# Patient Record
Sex: Male | Born: 1995 | Race: White | Hispanic: No | Marital: Married | Smoking: Never smoker
Health system: Southern US, Community
[De-identification: ages and names within clinical notes are randomized; demographics above are authoritative.]

## PROBLEM LIST (undated history)

## (undated) DIAGNOSIS — R51 Headache: Secondary | ICD-10-CM

## (undated) HISTORY — DX: Headache: R51

## (undated) HISTORY — PX: OTHER SURGICAL HISTORY: SHX169

---
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
## 2020-05-19 ENCOUNTER — Other Ambulatory Visit: Payer: Self-pay

## 2020-05-19 ENCOUNTER — Emergency Department
Admission: EM | Admit: 2020-05-19 | Discharge: 2020-05-19 | Disposition: A | Discharge status: Home / Self Care | Payer: 59 | Attending: Emergency Medicine | Admitting: Emergency Medicine

## 2020-05-19 ENCOUNTER — Emergency Department: Payer: 59

## 2020-05-19 DIAGNOSIS — R000 Tachycardia, unspecified: Secondary | ICD-10-CM | POA: Diagnosis not present

## 2020-05-19 DIAGNOSIS — R109 Unspecified abdominal pain: Secondary | ICD-10-CM

## 2020-05-19 DIAGNOSIS — R1031 Right lower quadrant pain: Secondary | ICD-10-CM | POA: Diagnosis present

## 2020-05-19 LAB — URINALYSIS, COMPLETE (UACMP) WITH MICROSCOPIC
Bacteria, UA: NONE SEEN
Bilirubin Urine: NEGATIVE
Glucose, UA: NEGATIVE mg/dL
Hgb urine dipstick: NEGATIVE
Ketones, ur: NEGATIVE mg/dL
Leukocytes,Ua: NEGATIVE
Nitrite: NEGATIVE
Protein, ur: NEGATIVE mg/dL
Specific Gravity, Urine: 1.012 (ref 1.005–1.030)
Squamous Epithelial / HPF: NONE SEEN (ref 0–5)
pH: 6 (ref 5.0–8.0)

## 2020-05-19 LAB — CBC
HCT: 47.1 % (ref 39.0–52.0)
Hemoglobin: 16.9 g/dL (ref 13.0–17.0)
MCH: 30.8 pg (ref 26.0–34.0)
MCHC: 35.9 g/dL (ref 30.0–36.0)
MCV: 85.9 fL (ref 80.0–100.0)
Platelets: 248 10*3/uL (ref 150–400)
RBC: 5.48 MIL/uL (ref 4.22–5.81)
RDW: 12.6 % (ref 11.5–15.5)
WBC: 7.2 10*3/uL (ref 4.0–10.5)
nRBC: 0 % (ref 0.0–0.2)

## 2020-05-19 LAB — COMPREHENSIVE METABOLIC PANEL
ALT: 15 U/L (ref 0–44)
AST: 21 U/L (ref 15–41)
Albumin: 5.1 g/dL — ABNORMAL HIGH (ref 3.5–5.0)
Alkaline Phosphatase: 63 U/L (ref 38–126)
Anion gap: 12 (ref 5–15)
BUN: 20 mg/dL (ref 6–20)
CO2: 23 mmol/L (ref 22–32)
Calcium: 9.7 mg/dL (ref 8.9–10.3)
Chloride: 103 mmol/L (ref 98–111)
Creatinine, Ser: 1.04 mg/dL (ref 0.61–1.24)
GFR, Estimated: >60 mL/min (ref >60)
Glucose, Bld: 117 mg/dL — ABNORMAL HIGH (ref 70–99)
Potassium: 3.3 mmol/L — ABNORMAL LOW (ref 3.5–5.1)
Sodium: 138 mmol/L (ref 135–145)
Total Bilirubin: 1.4 mg/dL — ABNORMAL HIGH (ref 0.3–1.2)
Total Protein: 7.9 g/dL (ref 6.5–8.1)

## 2020-05-19 LAB — LIPASE, BLOOD: Lipase: 29 U/L (ref 11–51)

## 2020-05-19 MED ORDER — LACTATED RINGERS IV BOLUS
1000.0000 mL | Freq: Once | INTRAVENOUS | Status: AC
  Administered 2020-05-19: 1000 mL via INTRAVENOUS

## 2020-05-19 MED ORDER — DICYCLOMINE HCL 10 MG PO CAPS: 10.0000 mg | ORAL_CAPSULE | Freq: Four times a day (QID) | ORAL | 0 refills | Status: AC

## 2020-05-19 MED ORDER — IOHEXOL 9 MG/ML PO SOLN
500.0000 mL | ORAL | Status: AC
  Administered 2020-05-19 (×2): 500 mL via ORAL

## 2020-05-19 MED ORDER — IOHEXOL 300 MG/ML  SOLN
100.0000 mL | Freq: Once | INTRAMUSCULAR | Status: AC | PRN
  Administered 2020-05-19: 100 mL via INTRAVENOUS

## 2020-05-19 MED ORDER — ONDANSETRON HCL 4 MG PO TABS: 4.0000 mg | ORAL_TABLET | Freq: Three times a day (TID) | ORAL | 0 refills | Status: AC | PRN

## 2020-05-19 NOTE — ED Notes (Signed)
Extra tube sent : Lactic Acid

## 2020-05-19 NOTE — ED Notes (Signed)
Wife at bedside.

## 2020-05-19 NOTE — ED Triage Notes (Addendum)
Pt to ER via POV with complaints of RUQ, stabbing and sharp in nature, that started 2 days ago. Pain radiates into back and ribs. Reports some diarrhea that has been green in color. Denies NV. Unsure if it has been worse when eating. Pt smokes delta 8 and has been trying to wean himself off it.

## 2020-05-19 NOTE — ED Provider Notes (Signed)
[facility] Emergency Department Provider Note ____________________________________________   Event Date/Time   First MD Initiated Contact with Patient 05/19/20 1859     (approximate)  I have reviewed the triage vital signs and the nursing notes.   HISTORY  Chief Complaint Abdominal Pain    HPI Gabriel Rich is a 25 y.o. male with PMH as noted below who presents with right-sided abdominal pain over approximately last 3 days, constant, somewhat radiating to the back, with no specific exacerbating or relieving factors.  The patient denies any associated nausea, vomiting, or diarrhea.  He has had a normal appetite and denies any change in the pain after eating.  The patient takes delta 8 [facility]) but denies other drug use.  Past Medical History:  Diagnosis Date  . VEHMCNOB(096.2)     Patient Active Problem List   Diagnosis Date Noted  . Migraine without aura, without mention of intractable migraine without mention of status migrainosus 08/11/2012  . Tension headache 08/11/2012  . Anxiety state, unspecified 08/11/2012    Past Surgical History:  Procedure Laterality Date  . Pyloric Stenosis  1999    Prior to Admission medications   Medication Sig Start Date End Date Taking? Authorizing Provider  cyproheptadine (PERIACTIN) 4 MG tablet Take 1 tablet (4 mg total) by mouth at bedtime. 09/10/12   Keturah Shavers, MD  ibuprofen (ADVIL,MOTRIN) 200 MG tablet Take 400 mg by mouth every 6 (six) hours as needed for pain.    [provider]  Magnesium Oxide 250 MG TABS Take by mouth.    [provider]  riboflavin (VITAMIN B-2) 100 MG TABS Take 100 mg by mouth daily.    [provider]    Allergies Patient has no known allergies.  Family History  Problem Relation Age of Onset  . Cervical cancer Mother     Social History Social History   Tobacco Use  . Smoking status: Never Smoker  . Smokeless tobacco: Never Used  Substance Use  Topics  . Alcohol use: No  . Drug use: No    Review of Systems  Constitutional: No fever/chills. Eyes: No visual changes. ENT: No sore throat. Cardiovascular: Denies chest pain. Respiratory: Denies shortness of breath. Gastrointestinal: No nausea, no vomiting.  No diarrhea.  Genitourinary: Negative for dysuria or hematuria.  Musculoskeletal: Negative for back pain. Skin: Negative for rash. Neurological: Negative for headaches, focal weakness or numbness.   ____________________________________________   PHYSICAL EXAM:  VITAL SIGNS: ED Triage Vitals [05/19/20 1828]  Enc Vitals Group     BP (!) 153/100     Pulse Rate (!) 120     Resp 18     Temp 99 F (37.2 C)     Temp Source Oral     SpO2 99 %     Weight      Height 5\' 10"  (1.778 m)     Head Circumference      Peak Flow      Pain Score 3     Pain Loc      Pain Edu?      Excl. in GC?     Constitutional: Alert and oriented.  Slightly anxious appearing but in no acute distress. Eyes: Conjunctivae are normal.  Head: Atraumatic. Nose: No congestion/rhinnorhea. Mouth/Throat: Mucous membranes are moist.   Neck: Normal range of motion.  Cardiovascular: Normal rate, regular rhythm. Good peripheral circulation. Respiratory: Normal respiratory effort.  No retractions. Gastrointestinal: Soft with minimal right mid and lower abdominal tenderness.  No peritoneal signs.  No distention.  Genitourinary: No flank tenderness. Musculoskeletal: Extremities warm and well perfused.  Neurologic:  Normal speech and language. No gross focal neurologic deficits are appreciated.  Skin:  Skin is warm and dry. No rash noted. Psychiatric: Mood and affect are normal. Speech and behavior are normal.  ____________________________________________   LABS (all labs ordered are listed, but only abnormal results are displayed)  Labs Reviewed  COMPREHENSIVE METABOLIC PANEL - Abnormal; Notable for the following components:      Result Value    Potassium 3.3 (*)    Glucose, Bld 117 (*)    Albumin 5.1 (*)    Total Bilirubin 1.4 (*)    All other components within normal limits  URINALYSIS, COMPLETE (UACMP) WITH MICROSCOPIC - Abnormal; Notable for the following components:   Color, Urine YELLOW (*)    APPearance CLEAR (*)    All other components within normal limits  LIPASE, BLOOD  CBC   ____________________________________________  EKG   ____________________________________________  RADIOLOGY  CT abdomen/pelvis: Pending  ____________________________________________   PROCEDURES  Procedure(s) performed: No  Procedures  Critical Care performed: No ____________________________________________   INITIAL IMPRESSION / ASSESSMENT AND PLAN / ED COURSE  Pertinent labs & imaging results that were available during my care of the patient were reviewed by me and considered in my medical decision making (see chart for details).  25 year old male with PMH as noted above presents with right-sided abdominal pain over the last several days with no associated vomiting or diarrhea.  On exam, the patient is overall well-appearing.  He is tachycardic with borderline elevated temperature and blood pressure.  The abdomen is soft with very mild tenderness to the right mid and lower abdomen with no peritoneal signs.  Overall the pain is poorly localized.  Differential includes appendicitis, biliary colic, cholecystitis, colitis, diverticulitis, enteritis, or less likely ureteral stone, pyelonephritis.  We will obtain lab work-up and CT for further evaluation.  ----------------------------------------- 8:50 PM on 05/19/2020 -----------------------------------------  CT abdomen is pending.  I signed the patient out to the oncoming ED physician Dr. Katrinka Blazing.  ____________________________________________   FINAL CLINICAL IMPRESSION(S) / ED DIAGNOSES  Final diagnoses:  Abdominal pain, unspecified abdominal location      NEW  MEDICATIONS STARTED DURING THIS VISIT:  New Prescriptions   No medications on file     Note:  This document was prepared using Dragon voice recognition software and may include unintentional dictation errors.    Dionne Bucy, MD 05/19/20 2050

## 2020-05-19 NOTE — ED Provider Notes (Signed)
I assumed care of this patient approximately 8:30 PM.  Please see outgoing providers note for full details regarding patient's initial evaluation assessment.  In brief patient presents for assessment of from acute onset of right abdominal pain associate with some bloody nonbilious emesis and some diarrhea for last 3 days.  Initial concern is for likely gastroenteritis given overall description of symptoms and reassuring lipase CMP CBC and UA.  However plan is to obtain CT abdomen pelvis to rule out other acute abdominal pelvic pathology including appendicitis.  CT shows no clear acute abdominal pelvic process including evidence of appendicitis, diverticulitis, pancreatitis, cystitis or any other clear acute process.  On my reassessment patient states he feels much better.  Discussed expected clinical course of likely infectious gastroenteritis.  Rx written for Zofran and Bentyl.  Discharge stable condition.  Strict return precautions advised and discussed.   Gilles Chiquito, MD 05/19/20 [phone]

## 2020-06-05 ENCOUNTER — Emergency Department
Admission: EM | Admit: 2020-06-05 | Discharge: 2020-06-05 | Disposition: A | Discharge status: Home / Self Care | Payer: 59 | Attending: Emergency Medicine | Admitting: Emergency Medicine

## 2020-06-05 ENCOUNTER — Encounter: Payer: Self-pay | Admitting: Intensive Care

## 2020-06-05 ENCOUNTER — Other Ambulatory Visit: Payer: Self-pay

## 2020-06-05 DIAGNOSIS — R194 Change in bowel habit: Secondary | ICD-10-CM | POA: Insufficient documentation

## 2020-06-05 DIAGNOSIS — R109 Unspecified abdominal pain: Secondary | ICD-10-CM | POA: Diagnosis present

## 2020-06-05 LAB — CBC
HCT: 48.1 % (ref 39.0–52.0)
Hemoglobin: 16.8 g/dL (ref 13.0–17.0)
MCH: 30.6 pg (ref 26.0–34.0)
MCHC: 34.9 g/dL (ref 30.0–36.0)
MCV: 87.6 fL (ref 80.0–100.0)
Platelets: 199 K/uL (ref 150–400)
RBC: 5.49 MIL/uL (ref 4.22–5.81)
RDW: 12.5 % (ref 11.5–15.5)
WBC: 5.5 K/uL (ref 4.0–10.5)
nRBC: 0 % (ref 0.0–0.2)

## 2020-06-05 LAB — COMPREHENSIVE METABOLIC PANEL WITH GFR
ALT: 12 U/L (ref 0–44)
AST: 17 U/L (ref 15–41)
Albumin: 4.8 g/dL (ref 3.5–5.0)
Alkaline Phosphatase: 50 U/L (ref 38–126)
Anion gap: 11 (ref 5–15)
BUN: 17 mg/dL (ref 6–20)
CO2: 26 mmol/L (ref 22–32)
Calcium: 9.9 mg/dL (ref 8.9–10.3)
Chloride: 102 mmol/L (ref 98–111)
Creatinine, Ser: 1.07 mg/dL (ref 0.61–1.24)
GFR, Estimated: >60 mL/min (ref >60)
Glucose, Bld: 109 mg/dL — ABNORMAL HIGH (ref 70–99)
Potassium: 3.6 mmol/L (ref 3.5–5.1)
Sodium: 139 mmol/L (ref 135–145)
Total Bilirubin: 1.7 mg/dL — ABNORMAL HIGH (ref 0.3–1.2)
Total Protein: 7.8 g/dL (ref 6.5–8.1)

## 2020-06-05 LAB — URINALYSIS, COMPLETE (UACMP) WITH MICROSCOPIC
Bacteria, UA: NONE SEEN
Bilirubin Urine: NEGATIVE
Glucose, UA: NEGATIVE mg/dL
Hgb urine dipstick: NEGATIVE
Ketones, ur: 5 mg/dL — AB
Leukocytes,Ua: NEGATIVE
Nitrite: NEGATIVE
Protein, ur: NEGATIVE mg/dL
Specific Gravity, Urine: 1.025 (ref 1.005–1.030)
Squamous Epithelial / HPF: NONE SEEN (ref 0–5)
pH: 6 (ref 5.0–8.0)

## 2020-06-05 LAB — LIPASE, BLOOD: Lipase: 45 U/L (ref 11–51)

## 2020-06-05 MED ORDER — SUCRALFATE 1 G PO TABS: 1.0000 g | ORAL_TABLET | Freq: Four times a day (QID) | ORAL | 0 refills | Status: AC

## 2020-06-05 MED ORDER — DICYCLOMINE HCL 10 MG PO CAPS: 10.0000 mg | ORAL_CAPSULE | Freq: Four times a day (QID) | ORAL | 0 refills | Status: AC

## 2020-06-05 MED ORDER — SUCRALFATE 1 G PO TABS: 1.0000 g | ORAL_TABLET | Freq: Four times a day (QID) | ORAL | 0 refills | Status: DC

## 2020-06-05 MED ORDER — DICYCLOMINE HCL 10 MG PO CAPS: 10.0000 mg | ORAL_CAPSULE | Freq: Four times a day (QID) | ORAL | 0 refills | Status: DC

## 2020-06-05 MED ORDER — SUCRALFATE 1 G PO TABS
1.0000 g | ORAL_TABLET | Freq: Once | ORAL | Status: AC
  Administered 2020-06-05: 1 g via ORAL
  Filled 2020-06-05: qty 1

## 2020-06-05 NOTE — ED Provider Notes (Signed)
[facility] Emergency Department Provider Note   ____________________________________________   I have reviewed the triage vital signs and the nursing notes.   HISTORY  Chief Complaint Abdominal Pain and Nausea   History limited by: Not Limited   HPI Gabriel Rich is a 25 y.o. male who presents to the emergency department today because of concerns for abdominal pain and abnormal stools.  States he has now been dealing with abdominal pain for the past month.  He says that initially the pain started more in the right side of his abdomen but now feels like it is across his whole abdomen.  In addition he has noticed a change to his stool.  It has become more yellow as well as seeing small white objects in his stool.  He denies any unintentional weight loss.  He denies any fevers.  He states the pain will become so severe he will cry.  The patient has tried over-the-counter abdominal medications for the pain without any significant relief.  Is also tried some Bentyl she says did not provide any significant relief.     Records reviewed. Per medical record review patient has a history of ER visit 3 weeks ago with work up including CT abd/pel without obvious source of the patient's pain.   Past Medical History:  Diagnosis Date  . IRCVELFY(101.7)     Patient Active Problem List   Diagnosis Date Noted  . Migraine without aura, without mention of intractable migraine without mention of status migrainosus 08/11/2012  . Tension headache 08/11/2012  . Anxiety state, unspecified 08/11/2012    Past Surgical History:  Procedure Laterality Date  . Pyloric Stenosis  1999    Prior to Admission medications   Medication Sig Start Date End Date Taking? Authorizing Provider  cyproheptadine (PERIACTIN) 4 MG tablet Take 1 tablet (4 mg total) by mouth at bedtime. 09/10/12   Keturah Shavers, MD  dicyclomine (BENTYL) 10 MG capsule Take 1 capsule (10 mg total) by mouth 4 (four)  times daily for 3 days. 05/19/20 05/22/20  Gilles Chiquito, MD  ibuprofen (ADVIL,MOTRIN) 200 MG tablet Take 400 mg by mouth every 6 (six) hours as needed for pain.    [provider]  Magnesium Oxide 250 MG TABS Take by mouth.    [provider]  ondansetron (ZOFRAN) 4 MG tablet Take 1 tablet (4 mg total) by mouth every 8 (eight) hours as needed for up to 10 doses for nausea or vomiting. 05/19/20   Gilles Chiquito, MD  riboflavin (VITAMIN B-2) 100 MG TABS Take 100 mg by mouth daily.    [provider]    Allergies Patient has no known allergies.  Family History  Problem Relation Age of Onset  . Cervical cancer Mother     Social History Social History   Tobacco Use  . Smoking status: Never Smoker  . Smokeless tobacco: Never Used  Substance Use Topics  . Alcohol use: No  . Drug use: No    Review of Systems Constitutional: No fever/chills Eyes: No visual changes. ENT: No sore throat. Cardiovascular: Denies chest pain. Respiratory: Denies shortness of breath. Gastrointestinal: Positive for abdominal pain, change in stool.  Genitourinary: Negative for dysuria. Musculoskeletal: Negative for back pain. Skin: Negative for rash. Neurological: Negative for headaches, focal weakness or numbness.  ____________________________________________   PHYSICAL EXAM:  VITAL SIGNS: ED Triage Vitals  Enc Vitals Group     BP 06/05/20 1243 130/86     Pulse Rate 06/05/20 1243 (!)  110     Resp 06/05/20 1243 16     Temp 06/05/20 1243 98.2 F (36.8 C)     Temp Source 06/05/20 1243 Oral     SpO2 06/05/20 1243 98 %     Weight 06/05/20 1244 135 lb (61.2 kg)     Height 06/05/20 1244 5\' 10"  (1.778 m)     Head Circumference --      Peak Flow --      Pain Score 06/05/20 1244 3   Constitutional: Alert and oriented.  Eyes: Conjunctivae are normal.  ENT      Head: Normocephalic and atraumatic.      Nose: No congestion/rhinnorhea.      Mouth/Throat: Mucous membranes  are moist.      Neck: No stridor. Hematological/Lymphatic/Immunilogical: No cervical lymphadenopathy. Cardiovascular: Normal rate, regular rhythm.  No murmurs, rubs, or gallops.  Respiratory: Normal respiratory effort without tachypnea nor retractions. Breath sounds are clear and equal bilaterally. No wheezes/rales/rhonchi. Gastrointestinal: Soft and non tender. No rebound. No guarding.  Genitourinary: Deferred Musculoskeletal: Normal range of motion in all extremities. No lower extremity edema. Neurologic:  Normal speech and language. No gross focal neurologic deficits are appreciated.  Skin:  Skin is warm, dry and intact. No rash noted. Psychiatric: Mood and affect are normal. Speech and behavior are normal. Patient exhibits appropriate insight and judgment.  ____________________________________________    LABS (pertinent positives/negatives)  CMP wnl except glu 109, t bili 1.7 UA clear, ketones 5, rbc and wbc 0-5 Lipase 45 CBC wbc 5.5, hgb 16.8, plt 199 ____________________________________________   EKG  None  ____________________________________________    RADIOLOGY  None  ____________________________________________   PROCEDURES  Procedures  ____________________________________________   INITIAL IMPRESSION / ASSESSMENT AND PLAN / ED COURSE  Pertinent labs & imaging results that were available during my care of the patient were reviewed by me and considered in my medical decision making (see chart for details).   Patient presented to the emergency department because of concern for abdominal pain and stool change. The patient states that it has been going on for almost a month. On exam today patient without any tenderness. Did offer to obtain stool studies however patient was unable to produce stool here. Did perhaps feel better with carafate. At this time do not feel repeat emergent imaging is necessary. Has follow up with GI scheduled.    ____________________________________________   FINAL CLINICAL IMPRESSION(S) / ED DIAGNOSES  Final diagnoses:  Abdominal pain, unspecified abdominal location     Note: This dictation was prepared with Dragon dictation. Any transcriptional errors that result from this process are unintentional     06/07/20, MD 06/05/20 2005

## 2020-06-05 NOTE — Discharge Instructions (Addendum)
Please seek medical attention for any high fevers, chest pain, shortness of breath, change in behavior, persistent vomiting, bloody stool or any other new or concerning symptoms.  

## 2020-06-05 NOTE — ED Triage Notes (Signed)
Patient c/o abdominal pain with nausea. Reports he is finding white spots in his stool and believes he has parasites. Also c/o feeling tired often. Was seen for same on 05/19/20 but reports now the abdominal pain is spreading and worse.

## 2020-06-14 ENCOUNTER — Other Ambulatory Visit: Payer: Self-pay

## 2020-06-14 ENCOUNTER — Emergency Department
Admission: EM | Admit: 2020-06-14 | Discharge: 2020-06-15 | Disposition: A | Discharge status: Home / Self Care | Payer: 59 | Attending: Emergency Medicine | Admitting: Emergency Medicine

## 2020-06-14 DIAGNOSIS — R200 Anesthesia of skin: Secondary | ICD-10-CM | POA: Insufficient documentation

## 2020-06-14 DIAGNOSIS — R000 Tachycardia, unspecified: Secondary | ICD-10-CM | POA: Diagnosis not present

## 2020-06-14 DIAGNOSIS — R112 Nausea with vomiting, unspecified: Secondary | ICD-10-CM

## 2020-06-14 DIAGNOSIS — R079 Chest pain, unspecified: Secondary | ICD-10-CM | POA: Diagnosis present

## 2020-06-14 DIAGNOSIS — F419 Anxiety disorder, unspecified: Secondary | ICD-10-CM

## 2020-06-14 DIAGNOSIS — R109 Unspecified abdominal pain: Secondary | ICD-10-CM

## 2020-06-14 DIAGNOSIS — R1010 Upper abdominal pain, unspecified: Secondary | ICD-10-CM | POA: Insufficient documentation

## 2020-06-14 DIAGNOSIS — R0789 Other chest pain: Secondary | ICD-10-CM

## 2020-06-14 DIAGNOSIS — G8929 Other chronic pain: Secondary | ICD-10-CM

## 2020-06-14 LAB — T4, FREE: Free T4: 0.98 ng/dL (ref 0.61–1.12)

## 2020-06-14 LAB — CBC WITH DIFFERENTIAL/PLATELET
Abs Immature Granulocytes: 0.01 10*3/uL (ref 0.00–0.07)
Basophils Absolute: 0.1 10*3/uL (ref 0.0–0.1)
Basophils Relative: 1 %
Eosinophils Absolute: 0.3 10*3/uL (ref 0.0–0.5)
Eosinophils Relative: 6 %
HCT: 42.8 % (ref 39.0–52.0)
Hemoglobin: 15.1 g/dL (ref 13.0–17.0)
Immature Granulocytes: 0 %
Lymphocytes Relative: 29 %
Lymphs Abs: 1.7 10*3/uL (ref 0.7–4.0)
MCH: 31.3 pg (ref 26.0–34.0)
MCHC: 35.3 g/dL (ref 30.0–36.0)
MCV: 88.8 fL (ref 80.0–100.0)
Monocytes Absolute: 0.5 10*3/uL (ref 0.1–1.0)
Monocytes Relative: 9 %
Neutro Abs: 3.2 10*3/uL (ref 1.7–7.7)
Neutrophils Relative %: 55 %
Platelets: 201 10*3/uL (ref 150–400)
RBC: 4.82 MIL/uL (ref 4.22–5.81)
RDW: 12.4 % (ref 11.5–15.5)
WBC: 5.7 10*3/uL (ref 4.0–10.5)
nRBC: 0 % (ref 0.0–0.2)

## 2020-06-14 LAB — COMPREHENSIVE METABOLIC PANEL
ALT: 13 U/L (ref 0–44)
AST: 17 U/L (ref 15–41)
Albumin: 4.4 g/dL (ref 3.5–5.0)
Alkaline Phosphatase: 45 U/L (ref 38–126)
Anion gap: 10 (ref 5–15)
BUN: 15 mg/dL (ref 6–20)
CO2: 23 mmol/L (ref 22–32)
Calcium: 9.5 mg/dL (ref 8.9–10.3)
Chloride: 106 mmol/L (ref 98–111)
Creatinine, Ser: 0.93 mg/dL (ref 0.61–1.24)
GFR, Estimated: >60 mL/min (ref >60)
Glucose, Bld: 117 mg/dL — ABNORMAL HIGH (ref 70–99)
Potassium: 3.5 mmol/L (ref 3.5–5.1)
Sodium: 139 mmol/L (ref 135–145)
Total Bilirubin: 0.9 mg/dL (ref 0.3–1.2)
Total Protein: 7.3 g/dL (ref 6.5–8.1)

## 2020-06-14 LAB — TSH: TSH: 0.795 u[IU]/mL (ref 0.350–4.500)

## 2020-06-14 LAB — TROPONIN I (HIGH SENSITIVITY): Troponin I (High Sensitivity): <2 ng/L (ref <18)

## 2020-06-14 MED ORDER — ACETAMINOPHEN 500 MG PO TABS
1000.0000 mg | ORAL_TABLET | Freq: Once | ORAL | Status: AC
  Administered 2020-06-14: 1000 mg via ORAL
  Filled 2020-06-14: qty 2

## 2020-06-14 MED ORDER — LORAZEPAM 2 MG/ML IJ SOLN
0.5000 mg | Freq: Once | INTRAMUSCULAR | Status: AC
  Administered 2020-06-14: 0.5 mg via INTRAVENOUS
  Filled 2020-06-14: qty 1

## 2020-06-14 MED ORDER — SODIUM CHLORIDE 0.9 % IV BOLUS
1000.0000 mL | Freq: Once | INTRAVENOUS | Status: AC
  Administered 2020-06-14: 1000 mL via INTRAVENOUS

## 2020-06-14 NOTE — ED Provider Notes (Addendum)
[facility] Emergency Department Provider Note  ____________________________________________   Event Date/Time   First MD Initiated Contact with Patient 06/14/20 2236     (approximate)  I have reviewed the triage vital signs and the nursing notes.   HISTORY  Chief Complaint Chest Pain    HPI Gabriel Rich is a 25 y.o. male with headaches and anxiety who comes in with chest pain.  Patient reports that he today he developed left arm numbness which she describes as feeling heavy and "just not feeling quite right".  He states that he then developed some chest pain that was on the left shoulder and sharp stabbing pain in his chest.  He also reports chronic upper abdominal pain as well that is severe, constant.  He states that he is told that he has gallstones but I reviewed his prior CT and there are no gallstones mention.  Patient has GI follow-up in 2 days. He also reports some testicle pain none currently but previously and stretch marks by his legs.       Past Medical History:  Diagnosis Date  . VVOHYWVP(710.6)     Patient Active Problem List   Diagnosis Date Noted  . Migraine without aura, without mention of intractable migraine without mention of status migrainosus 08/11/2012  . Tension headache 08/11/2012  . Anxiety state, unspecified 08/11/2012    Past Surgical History:  Procedure Laterality Date  . Pyloric Stenosis  1999    Prior to Admission medications   Medication Sig Start Date End Date Taking? Authorizing Provider  cyproheptadine (PERIACTIN) 4 MG tablet Take 1 tablet (4 mg total) by mouth at bedtime. 09/10/12   Keturah Shavers, MD  dicyclomine (BENTYL) 10 MG capsule Take 1 capsule (10 mg total) by mouth 4 (four) times daily for 3 days. 05/19/20 05/22/20  Gilles Chiquito, MD  dicyclomine (BENTYL) 10 MG capsule Take 1 capsule (10 mg total) by mouth 4 (four) times daily for 14 days. 06/05/20 06/19/20  Phineas Semen, MD  ibuprofen  (ADVIL,MOTRIN) 200 MG tablet Take 400 mg by mouth every 6 (six) hours as needed for pain.    [provider]  Magnesium Oxide 250 MG TABS Take by mouth.    [provider]  ondansetron (ZOFRAN) 4 MG tablet Take 1 tablet (4 mg total) by mouth every 8 (eight) hours as needed for up to 10 doses for nausea or vomiting. 05/19/20   Gilles Chiquito, MD  riboflavin (VITAMIN B-2) 100 MG TABS Take 100 mg by mouth daily.    [provider]  sucralfate (CARAFATE) 1 g tablet Take 1 tablet (1 g total) by mouth 4 (four) times daily. 06/05/20   Phineas Semen, MD    Allergies Bentyl [dicyclomine]  Family History  Problem Relation Age of Onset  . Cervical cancer Mother     Social History Social History   Tobacco Use  . Smoking status: Never Smoker  . Smokeless tobacco: Never Used  Substance Use Topics  . Alcohol use: No  . Drug use: No      Review of Systems Constitutional: No fever/chills Eyes: No visual changes. ENT: No sore throat. Cardiovascular: Positive chest pain Respiratory: Denies shortness of breath. Gastrointestinal: Positive abdominal pain no nausea, no vomiting.  No diarrhea.  No constipation. Genitourinary: Negative for dysuria. Musculoskeletal: Negative for back pain. Skin: Negative for rash. Neurological: Negative for headaches, positive for numbness All other ROS negative ____________________________________________   PHYSICAL EXAM:  VITAL SIGNS: ED Triage Vitals  Enc Vitals Group     BP 06/14/20 2237 (!) 153/86     Pulse Rate 06/14/20 2237 (!) 127     Resp 06/14/20 2237 18     Temp 06/14/20 2238 98.3 F (36.8 C)     Temp Source 06/14/20 2238 Oral     SpO2 06/14/20 2237 100 %     Weight 06/14/20 2235 140 lb (63.5 kg)     Height 06/14/20 2235 5\' 10"  (1.778 m)     Head Circumference --      Peak Flow --      Pain Score 06/14/20 2234 6     Pain Loc --      Pain Edu? --      Excl. in [place]? --     Constitutional: Alert and  oriented. Well appearing and in no acute distress.  Patient does appear very anxious Eyes: Conjunctivae are normal. EOMI. Head: Atraumatic. Nose: No congestion/rhinnorhea. Mouth/Throat: Mucous membranes are moist.   Neck: No stridor. Trachea Midline. FROM Cardiovascular: Tachycardic, regular rhythm. Grossly normal heart sounds.  Good peripheral circulation. Respiratory: Normal respiratory effort.  No retractions. Lungs CTAB. Gastrointestinal: Tender in the upper abdomen.  No distention. No abdominal bruits.  Musculoskeletal: No lower extremity tenderness nor edema.  No joint effusions. Neurologic:  Normal speech and language.  No pronator drift but does report that the left arm just does not feel the same as the right arm.  Good strength in legs.  Equal sensation in legs. Skin:  Skin is warm, dry and intact. No rash noted. Psychiatric: Mood and affect are normal. Speech and behavior are normal. GU: Deferred   ____________________________________________   LABS (all labs ordered are listed, but only abnormal results are displayed)  Labs Reviewed  CHLAMYDIA/NGC RT PCR ([facility] ONLY)  CBC WITH DIFFERENTIAL/PLATELET  COMPREHENSIVE METABOLIC PANEL  URINALYSIS, COMPLETE (UACMP) WITH MICROSCOPIC  TSH  T4, FREE  TROPONIN I (HIGH SENSITIVITY)   ____________________________________________   ED ECG REPORT I, 2235, the attending physician, personally viewed and interpreted this ECG.  Sinus tachycardia rate of 123, no ST elevation, T wave version in lead III and V3, normal intervals ____________________________________________  RADIOLOGY Concha Se, personally viewed and evaluated these images (plain radiographs) as part of my medical decision making, as well as reviewing the written report by the radiologist.  ED MD interpretation: Pending  Official radiology report(s): No results  found.  ____________________________________________   PROCEDURES  Procedure(s) performed (including Critical Care):  .1-3 Lead EKG Interpretation Performed by: Vela Prose, MD Authorized by: Concha Se, MD     Interpretation: abnormal     ECG rate:  100s   ECG rate assessment: tachycardic     Rhythm: sinus tachycardia     Ectopy: none     Conduction: normal   Comments:     Patient initially sinus tachycardia but came down with fluids to sinus     ____________________________________________   INITIAL IMPRESSION / ASSESSMENT AND PLAN / ED COURSE  Gabriel Rich was evaluated in Emergency Department on 06/14/2020 for the symptoms described in the history of present illness. He was evaluated in the context of the global COVID-19 pandemic, which necessitated consideration that the patient might be at risk for infection with the SARS-CoV-2 virus that causes COVID-19. Institutional protocols and algorithms that pertain to the evaluation of patients at risk for COVID-19 are in a state of rapid change based on information released by regulatory bodies including the CDC and  federal and state organizations. These policies and algorithms were followed during the patient's care in the ED.    Patient is a 25 year old who comes in hypertensive and significantly tachycardic with both chest abdominal pain and reported left arm heaviness.  His neuro exam is otherwise very reassuring I have very low suspicion for intracranial hemorrhage or stroke will get CT head to further evaluate.  I also will get CT dissection to make sure no evidence of dissection.  Will get labs to evaluate for ACS, electrolyte abnormalities, thyroid dysfunction.  We will give some Tylenol, IV Ativan and some fluids to help with symptoms given I do feel the patient seems to be very anxious.  Patient will be handed off to oncoming team pending labs, CT scans and reevaluation.          ____________________________________________   FINAL CLINICAL IMPRESSION(S) / ED DIAGNOSES   Final diagnoses:  None      MEDICATIONS GIVEN DURING THIS VISIT:  Medications  acetaminophen (TYLENOL) tablet 1,000 mg (has no administration in time range)  LORazepam (ATIVAN) injection 0.5 mg (has no administration in time range)  sodium chloride 0.9 % bolus 1,000 mL (1,000 mLs Intravenous New Bag/Given 06/14/20 2304)     ED Discharge Orders    None       Note:  This document was prepared using Dragon voice recognition software and may include unintentional dictation errors.   Concha Se, MD 06/14/20 2316    Concha Se, MD 06/14/20 [phone]

## 2020-06-14 NOTE — ED Provider Notes (Signed)
-----------------------------------------   11:55 PM on 06/14/2020 -----------------------------------------  Assuming care from Dr. Fuller Plan.  In short, Gabriel Rich is a 25 y.o. male with multiple complaints.  Refer to the original H&P for additional details.  The current plan of care is to follow up labs and imaging.    ----------------------------------------- 2:19 AM on 06/15/2020 -----------------------------------------  The patient's urinalysis, GC/chlamydia, free T4, TSH, comprehensive metabolic panel, CBC, and high-sensitivity troponin x 2 are all within normal limits.  The patient's CTA chest/abdomen/pelvis and head CT are also within normal limits showing no evidence of any acute abnormality.  I provided reassurance to the patient and his wife and suggested that anxiety and/or panic attacks may be playing a significant role in his symptoms.  He understands.  He said that he has a follow-up appointment in 2 days with gastroenterology and I encouraged him to keep that appointment.  I gave my usual and customary return precautions.   Loleta Rose, MD 06/15/20 [phone]

## 2020-06-14 NOTE — ED Triage Notes (Addendum)
Pt states for the past week his has been having left arm numbness, pt states worse today as well as a heavy feeling in the middle of his chest. Pt states at times he does also feel sob, pt denies dizziness or light headedness. Pt denies any cardiac hx, pt states that he is suspected to have gallstones have been here recently for right side abd pain

## 2020-06-15 ENCOUNTER — Emergency Department: Payer: 59

## 2020-06-15 ENCOUNTER — Encounter: Payer: Self-pay | Admitting: Radiology

## 2020-06-15 LAB — URINALYSIS, COMPLETE (UACMP) WITH MICROSCOPIC
Bacteria, UA: NONE SEEN
Bilirubin Urine: NEGATIVE
Glucose, UA: NEGATIVE mg/dL
Hgb urine dipstick: NEGATIVE
Ketones, ur: 5 mg/dL — AB
Leukocytes,Ua: NEGATIVE
Nitrite: NEGATIVE
Protein, ur: NEGATIVE mg/dL
Specific Gravity, Urine: 1.019 (ref 1.005–1.030)
Squamous Epithelial / HPF: NONE SEEN (ref 0–5)
pH: 6 (ref 5.0–8.0)

## 2020-06-15 LAB — CHLAMYDIA/NGC RT PCR (ARMC ONLY)
Chlamydia Tr: NOT DETECTED
N gonorrhoeae: NOT DETECTED

## 2020-06-15 LAB — TROPONIN I (HIGH SENSITIVITY): Troponin I (High Sensitivity): <2 ng/L (ref <18)

## 2020-06-15 MED ORDER — IOHEXOL 350 MG/ML SOLN
100.0000 mL | Freq: Once | INTRAVENOUS | Status: AC | PRN
  Administered 2020-06-15: 100 mL via INTRAVENOUS

## 2020-06-15 NOTE — ED Notes (Signed)
Pt ambulatory to POV without difficulty. VSS. NAD.

## 2020-06-15 NOTE — Discharge Instructions (Signed)
Your workup in the Emergency Department today was reassuring.  We did not find any specific abnormalities.  We recommend you drink plenty of fluids, take your regular medications and/or any new ones prescribed today, and follow up with the doctor(s) listed in these documents as recommended.  Return to the Emergency Department if you develop new or worsening symptoms that concern you.  

## 2022-01-06 IMAGING — CT CT ABD-PELV W/ CM
2 of 4 series · 16 of 46 positions shown, 18 images · IV contrast (APPLIED)
Comparison: None.

CLINICAL DATA: Right lower quadrant pain

EXAM:
CT ABDOMEN AND PELVIS WITH CONTRAST
TECHNIQUE: Multidetector CT imaging of the abdomen and pelvis was performed
using the standard protocol following bolus administration of
intravenous contrast.
CONTRAST:  100mL OMNIPAQUE IOHEXOL 300 MG/ML  SOLN

[Series 2: axial st · axial · 0.71mm/px · z∈[-581,-181]mm · 13 of 88 slices shown, 15 images]
[im 4/88  soft-tissue]
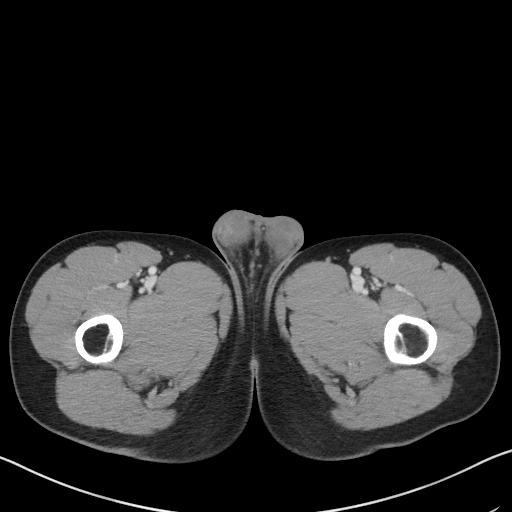
[im 4/88  bone]
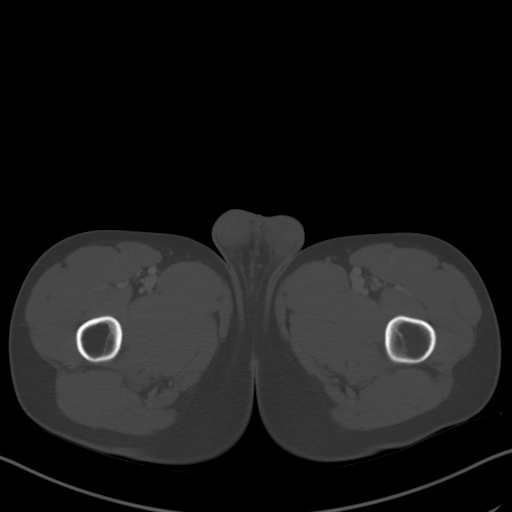
[im 11/88  soft-tissue]
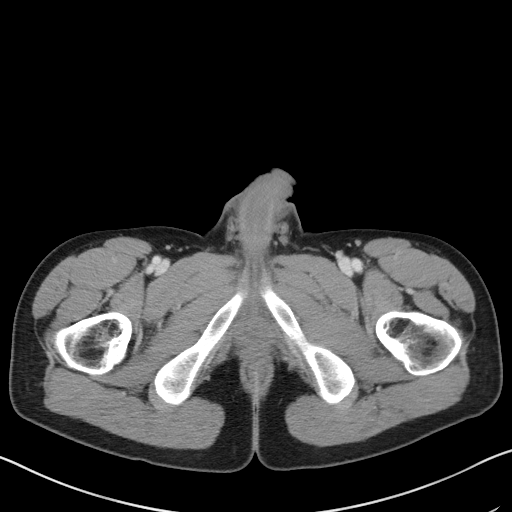
[im 18/88  soft-tissue]
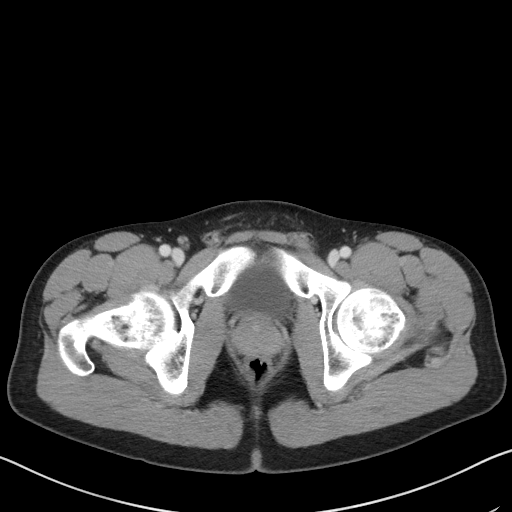
[im 25/88  soft-tissue]
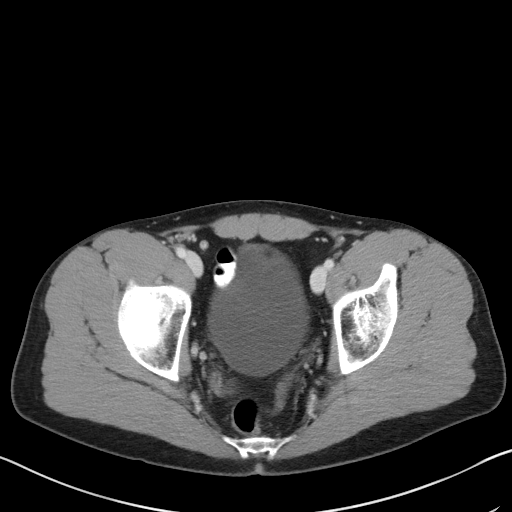
[im 32/88  soft-tissue]
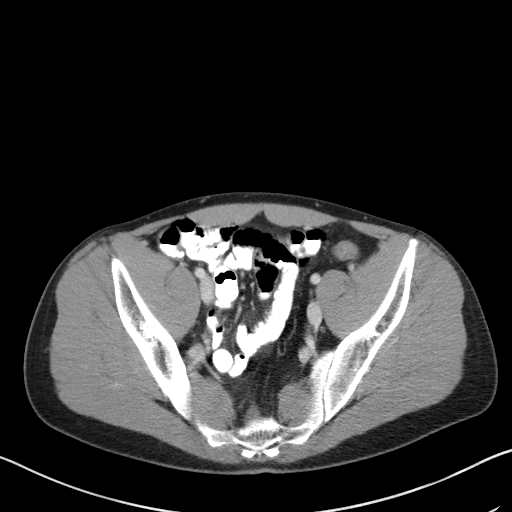
[im 39/88  soft-tissue]
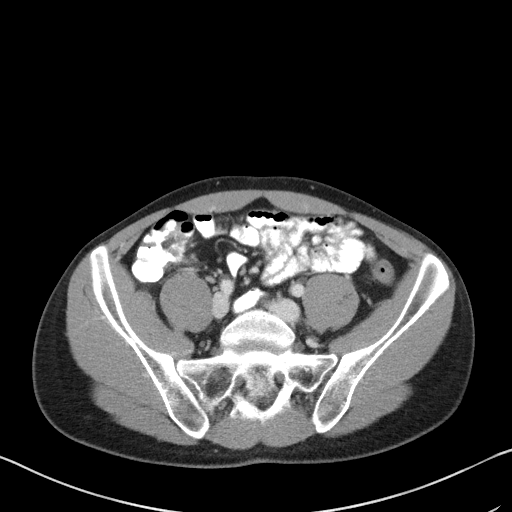
[im 46/88  soft-tissue]
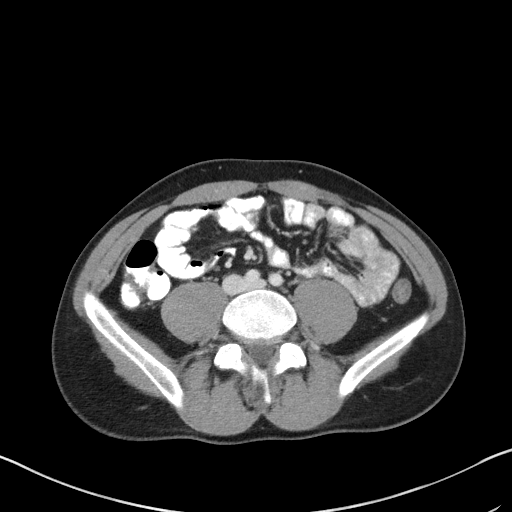
[im 49/88  soft-tissue]
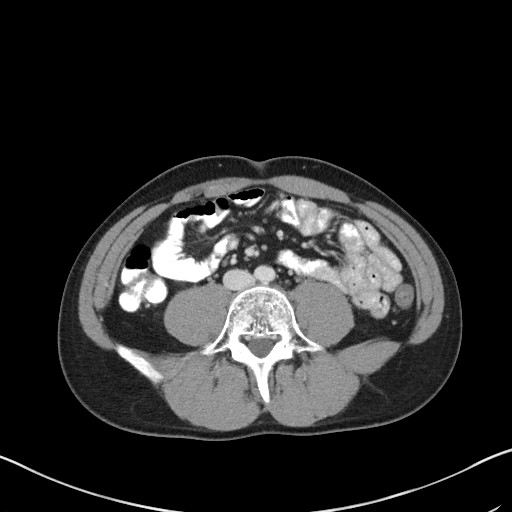
[im 56/88  soft-tissue]
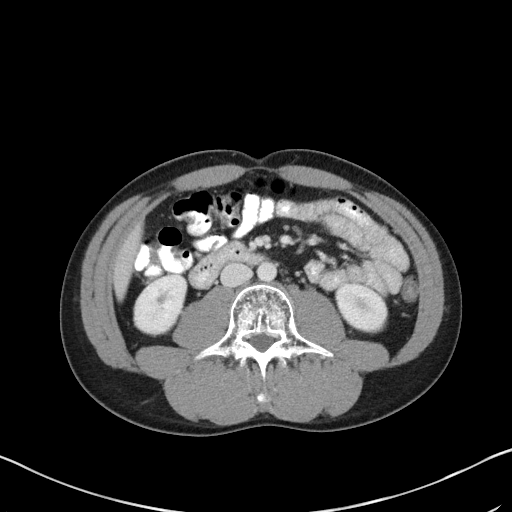
[im 56/88  bone]
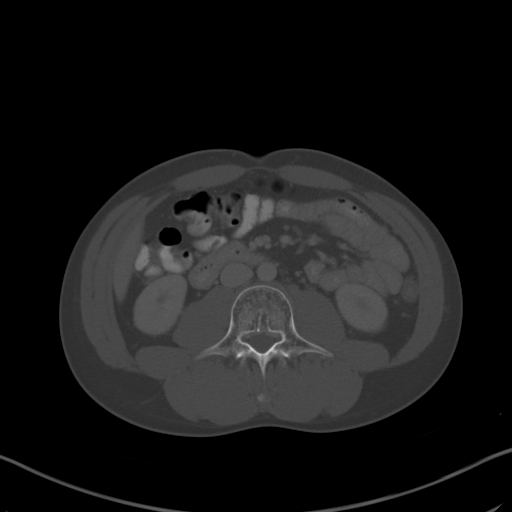
[im 63/88  soft-tissue]
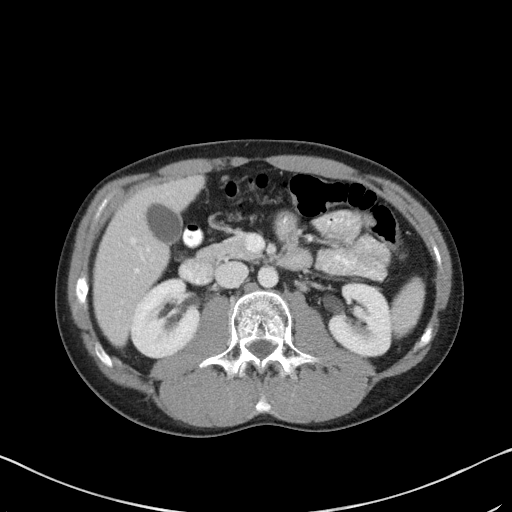
[im 70/88  soft-tissue]
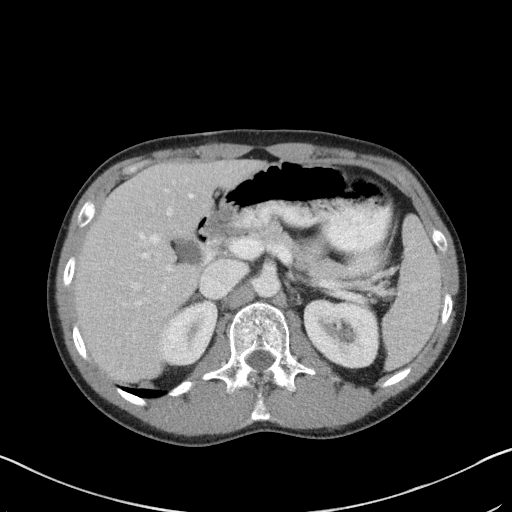
[im 77/88  soft-tissue]
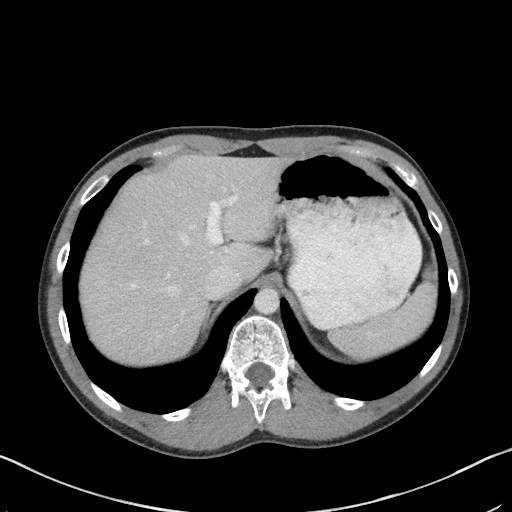
[im 84/88  soft-tissue]
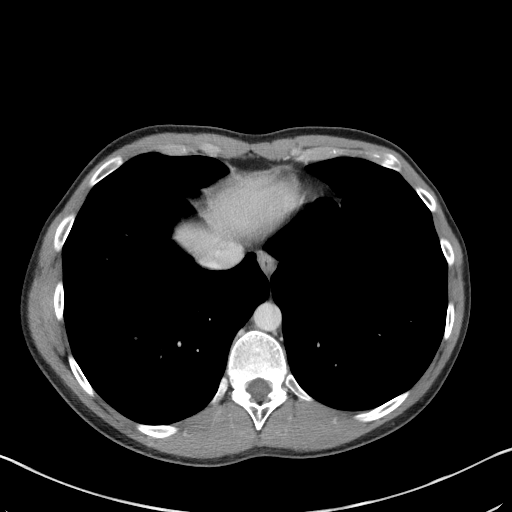

[Series 5: coronal st · coronal · 0.71mm/px · 3 of 78 slices shown]
[im 26/78  soft-tissue]
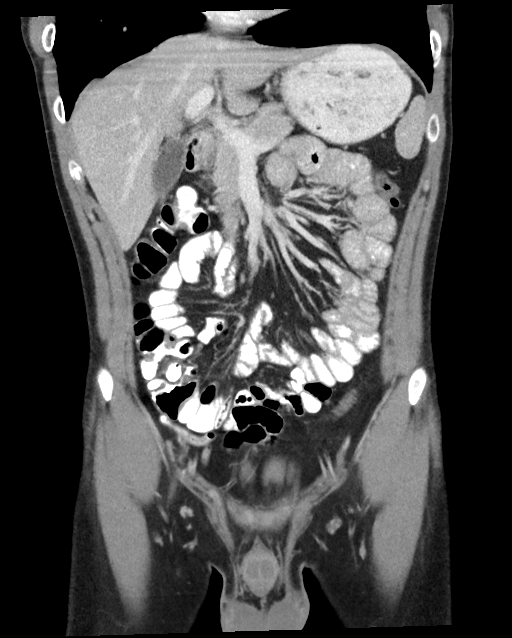
[im 35/78  soft-tissue]
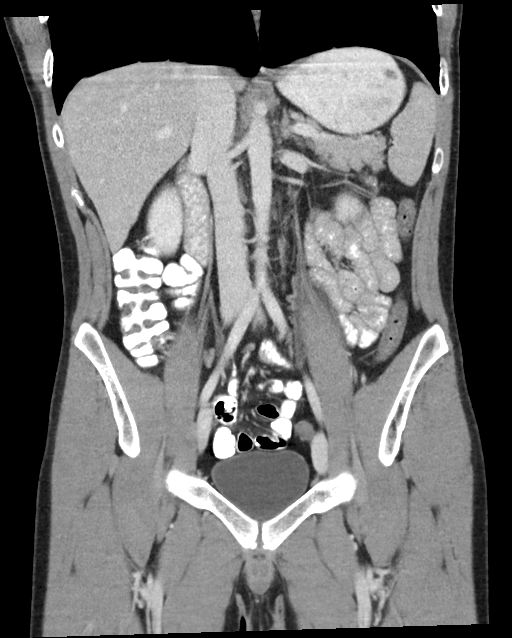
[im 43/78  soft-tissue]
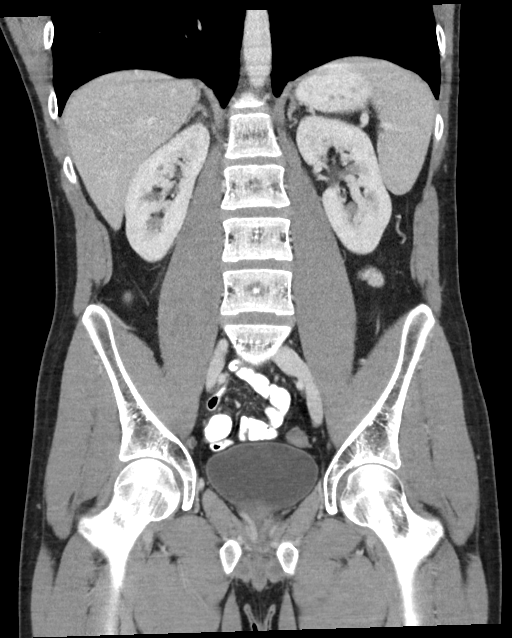

[16 of 46 positions shown; findings below may reference images not displayed]

FINDINGS: Lower chest: No acute abnormality.

Hepatobiliary: No focal liver abnormality is seen. No gallstones,
gallbladder wall thickening, or biliary dilatation.

Pancreas: Unremarkable. No pancreatic ductal dilatation or
surrounding inflammatory changes.

Spleen: Normal in size without focal abnormality.

Adrenals/Urinary Tract: Adrenal glands are unremarkable. Kidneys are
normal, without renal calculi, focal lesion, or hydronephrosis.
Bladder is unremarkable.

Stomach/Bowel: Stomach is within normal limits. Appendix appears
normal. No evidence of bowel wall thickening, distention, or
inflammatory changes.

Vascular/Lymphatic: No significant vascular findings are present. No
enlarged abdominal or pelvic lymph nodes.

Reproductive: Prostate is unremarkable.

Other: No abdominal wall hernia or abnormality. No abdominopelvic
ascites.

Musculoskeletal: No acute or significant osseous findings.
IMPRESSION: Negative. No CT evidence for acute intra-abdominal or pelvic
abnormality.

## 2022-05-01 ENCOUNTER — Other Ambulatory Visit: Payer: Self-pay

## 2022-05-01 DIAGNOSIS — D72829 Elevated white blood cell count, unspecified: Secondary | ICD-10-CM | POA: Insufficient documentation

## 2022-05-01 DIAGNOSIS — N179 Acute kidney failure, unspecified: Secondary | ICD-10-CM | POA: Diagnosis not present

## 2022-05-01 DIAGNOSIS — R197 Diarrhea, unspecified: Secondary | ICD-10-CM | POA: Insufficient documentation

## 2022-05-01 DIAGNOSIS — R112 Nausea with vomiting, unspecified: Secondary | ICD-10-CM | POA: Diagnosis not present

## 2022-05-01 LAB — CBC
HCT: 44.8 % (ref 39.0–52.0)
Hemoglobin: 15.9 g/dL (ref 13.0–17.0)
MCH: 31.4 pg (ref 26.0–34.0)
MCHC: 35.5 g/dL (ref 30.0–36.0)
MCV: 88.5 fL (ref 80.0–100.0)
Platelets: 217 10*3/uL (ref 150–400)
RBC: 5.06 MIL/uL (ref 4.22–5.81)
RDW: 12.7 % (ref 11.5–15.5)
WBC: 20.2 10*3/uL — ABNORMAL HIGH (ref 4.0–10.5)
nRBC: 0 % (ref 0.0–0.2)

## 2022-05-01 LAB — COMPREHENSIVE METABOLIC PANEL
ALT: 22 U/L (ref 0–44)
AST: 48 U/L — ABNORMAL HIGH (ref 15–41)
Albumin: 4.9 g/dL (ref 3.5–5.0)
Alkaline Phosphatase: 49 U/L (ref 38–126)
Anion gap: 15 (ref 5–15)
BUN: 24 mg/dL — ABNORMAL HIGH (ref 6–20)
CO2: 20 mmol/L — ABNORMAL LOW (ref 22–32)
Calcium: 10.3 mg/dL (ref 8.9–10.3)
Chloride: 105 mmol/L (ref 98–111)
Creatinine, Ser: 1.74 mg/dL — ABNORMAL HIGH (ref 0.61–1.24)
GFR, Estimated: 55 mL/min — ABNORMAL LOW (ref >60)
Glucose, Bld: 158 mg/dL — ABNORMAL HIGH (ref 70–99)
Potassium: 3.9 mmol/L (ref 3.5–5.1)
Sodium: 140 mmol/L (ref 135–145)
Total Bilirubin: 2.1 mg/dL — ABNORMAL HIGH (ref 0.3–1.2)
Total Protein: 8.4 g/dL — ABNORMAL HIGH (ref 6.5–8.1)

## 2022-05-01 LAB — LIPASE, BLOOD: Lipase: 29 U/L (ref 11–51)

## 2022-05-01 MED ORDER — ONDANSETRON HCL 4 MG/2ML IJ SOLN
4.0000 mg | Freq: Once | INTRAMUSCULAR | Status: AC | PRN
  Administered 2022-05-01: 4 mg via INTRAVENOUS
  Filled 2022-05-01: qty 2

## 2022-05-01 MED ORDER — SODIUM CHLORIDE 0.9 % IV BOLUS
1000.0000 mL | Freq: Once | INTRAVENOUS | Status: AC
  Administered 2022-05-01: 1000 mL via INTRAVENOUS

## 2022-05-01 NOTE — ED Triage Notes (Addendum)
Pt to ED via POV c/o diarrhea and vomiting. Pt reports this started around 3am. Has not been able to keep anything down. Pt actively throwing up in triage.

## 2022-05-02 ENCOUNTER — Emergency Department
Admission: EM | Admit: 2022-05-02 | Discharge: 2022-05-02 | Disposition: A | Discharge status: Home / Self Care | Payer: Medicaid Other | Attending: Emergency Medicine | Admitting: Emergency Medicine

## 2022-05-02 DIAGNOSIS — R112 Nausea with vomiting, unspecified: Secondary | ICD-10-CM

## 2022-05-02 DIAGNOSIS — N179 Acute kidney failure, unspecified: Secondary | ICD-10-CM

## 2022-05-02 LAB — URINALYSIS, ROUTINE W REFLEX MICROSCOPIC
Bacteria, UA: NONE SEEN
Bilirubin Urine: NEGATIVE
Glucose, UA: NEGATIVE mg/dL
Hgb urine dipstick: NEGATIVE
Ketones, ur: 20 mg/dL — AB
Leukocytes,Ua: NEGATIVE
Nitrite: NEGATIVE
Protein, ur: 30 mg/dL — AB
Specific Gravity, Urine: 1.012 (ref 1.005–1.030)
Squamous Epithelial / HPF: NONE SEEN /HPF (ref 0–5)
pH: 8 (ref 5.0–8.0)

## 2022-05-02 MED ORDER — PROCHLORPERAZINE MALEATE 5 MG PO TABS: 5.0000 mg | ORAL_TABLET | Freq: Four times a day (QID) | ORAL | 0 refills | Status: AC | PRN

## 2022-05-02 MED ORDER — FAMOTIDINE 20 MG PO TABS: 20.0000 mg | ORAL_TABLET | Freq: Two times a day (BID) | ORAL | 0 refills | Status: AC

## 2022-05-02 MED ORDER — LACTATED RINGERS IV BOLUS
1000.0000 mL | Freq: Once | INTRAVENOUS | Status: AC
  Administered 2022-05-02: 1000 mL via INTRAVENOUS

## 2022-05-02 MED ORDER — FAMOTIDINE 20 MG PO TABS
20.0000 mg | ORAL_TABLET | Freq: Once | ORAL | Status: AC
  Administered 2022-05-02: 20 mg via ORAL
  Filled 2022-05-02: qty 1

## 2022-05-02 NOTE — ED Provider Notes (Signed)
[facility] Provider Note    Event Date/Time   First MD Initiated Contact with Patient 05/02/22 0015     (approximate)   History   Diarrhea and Emesis   HPI  Equan Eisenzimmer is a 27 y.o. male with no significant past medical history presents with abdominal pain and vomiting.  Patient notes that since around 4 AM yesterday he has had fairly constant vomiting has had more episodes than he can count.  Not been able to tolerate liquids.  Has had some diarrhea as well nonbloody denies specific event associate abdominal pain has had some chills but no fever denies urinary symptoms.  Denies history of similar.  Does use marijuana but less than he used to.     Past Medical History:  Diagnosis Date   Headache(784.0)     Patient Active Problem List   Diagnosis Date Noted   Migraine without aura, without mention of intractable migraine without mention of status migrainosus 08/11/2012   Tension headache 08/11/2012   Anxiety state, unspecified 08/11/2012     Physical Exam  Triage Vital Signs: ED Triage Vitals [05/01/22 2117]  Enc Vitals Group     BP 110/86     Pulse Rate (!) 104     Resp 20     Temp 97.9 F (36.6 C)     Temp Source Oral     SpO2 97 %     Weight 120 lb (54.4 kg)     Height '5\' 11"'$  (1.803 m)     Head Circumference      Peak Flow      Pain Score      Pain Loc      Pain Edu?      Excl. in [place]?     Most recent vital signs: Vitals:   05/01/22 2117 05/02/22 0130  BP: 110/86   Pulse: (!) 104 64  Resp: 20 14  Temp: 97.9 F (36.6 C)   SpO2: 97%      General: Awake, no distress.  Looks dry CV:  Good peripheral perfusion.  Resp:  Normal effort.  Abd:  No distention.  Abdomen soft nontender, specifically no tenderness in the right lower quadrant Neuro:             Awake, Alert, Oriented x 3  Other:     ED Results / Procedures / Treatments  Labs (all labs ordered are listed, but only abnormal results are displayed) Labs Reviewed   COMPREHENSIVE METABOLIC PANEL - Abnormal; Notable for the following components:      Result Value   CO2 20 (*)    Glucose, Bld 158 (*)    BUN 24 (*)    Creatinine, Ser 1.74 (*)    Total Protein 8.4 (*)    AST 48 (*)    Total Bilirubin 2.1 (*)    GFR, Estimated 55 (*)    All other components within normal limits  CBC - Abnormal; Notable for the following components:   WBC 20.2 (*)    All other components within normal limits  URINALYSIS, ROUTINE W REFLEX MICROSCOPIC - Abnormal; Notable for the following components:   Color, Urine YELLOW (*)    APPearance CLEAR (*)    Ketones, ur 20 (*)    Protein, ur 30 (*)    All other components within normal limits  LIPASE, BLOOD     EKG     RADIOLOGY    PROCEDURES:  Critical Care performed: No  Procedures  MEDICATIONS ORDERED IN ED: Medications  ondansetron [facility]) injection 4 mg (4 mg Intravenous Given 05/01/22 2130)  sodium chloride 0.9 % bolus 1,000 mL (0 mLs Intravenous Stopped 05/02/22 0137)  lactated ringers bolus 1,000 mL (0 mLs Intravenous Stopped 05/02/22 0137)     IMPRESSION / MDM / ASSESSMENT AND PLAN / ED COURSE  I reviewed the triage vital signs and the nursing notes.                              Patient's presentation is most consistent with acute complicated illness / injury requiring diagnostic workup.  Differential diagnosis includes, but is not limited to, viral gastroenteritis, cyclic vomiting, cannabinoid hyperemesis syndrome, gastroparesis, AKI, less likely appendicitis or acute intra-abdominal process  Patient is a 27 year old male presents with vomiting and diarrhea for about 24 hours.  He has had countless episodes of emesis not able to tolerate p.o. with associated diarrhea that is nonbloody.  He has minimal associated abdominal pain just some mild pain when he is vomiting.  On arrival he is mildly tachycardic vitals are otherwise reassuring.  He does look dry abdominal exam is benign.  His CMP is  notable for an AKI creatinine 1.7, from baseline around 0.9.  He also has a leukocytosis to 20.  T. bili mildly elevated at 2 but AST ALT are essentially normal lipase is negative.  Based on his abdominal exam my suspicion for appendicitis or other acute abdominal surgical process is low.  Suspect either viral gastroenteritis versus cannabinol hyperemesis syndrome.  He is getting a liter of saline at the time my evaluation will give additional liter of LR.  He has received Zofran.  Will check EKG and give droperidol.  EKG shows a QTc of 502.  Patient did already receive Zofran so we will hold off on droperidol at this time.  Patient received 2 L of fluid.  After Zofran and fluids patient passed p.o. challenge was able to drink and have crackers in the ED.  On reassessment he is feeling improved.  Does feel some acid in his throat.  Will give Pepcid for this.  Denies any abdominal pain.  At this point I think he is appropriate to be discharged.  I did discuss his AKI with him.  Recommended he continue to stay hydrated and avoid any NSAIDs.       FINAL CLINICAL IMPRESSION(S) / ED DIAGNOSES   Final diagnoses:  Nausea and vomiting, unspecified vomiting type  AKI (acute kidney injury) ([place])     Rx / DC Orders   ED Discharge Orders          Ordered    prochlorperazine (COMPAZINE) 5 MG tablet  Every 6 hours PRN        05/02/22 0255    famotidine (PEPCID) 20 MG tablet  2 times daily        05/02/22 0255             Note:  This document was prepared using Dragon voice recognition software and may include unintentional dictation errors.   Rada Hay, MD 05/02/22 [phone]

## 2022-05-02 NOTE — Discharge Instructions (Addendum)
You likely have a viral illness causing your nausea and vomiting.  You did have a kidney injury from dehydration.  It is very important that you are staying hydrated.  You can take the Compazine as needed for nausea and vomiting.  If you are not able to keep fluids down please return to the emergency department.  Please stick to clear liquids and bland solids while you are still feeling sick.  You can advance your diet as tolerated.  Please avoid any NSAIDs including Excedrin ibuprofen Motrin etc.  You can take the Pepcid to help with acid production.
